# Patient Record
Sex: Male | Born: 1955 | Race: White | Hispanic: No | Marital: Married
Health system: Southern US, Community
[De-identification: ages and names within clinical notes are randomized; demographics above are authoritative.]

---

## 1998-03-23 ENCOUNTER — Observation Stay (HOSPITAL_COMMUNITY): Admission: RE | Admit: 1998-03-23 | Discharge: 1998-03-23 | Payer: Self-pay | Admitting: Neurosurgery

## 1998-03-23 ENCOUNTER — Encounter: Payer: Self-pay | Admitting: Neurosurgery

## 2005-08-29 ENCOUNTER — Emergency Department (HOSPITAL_COMMUNITY): Admission: EM | Admit: 2005-08-29 | Discharge: 2005-08-29 | Payer: Self-pay | Admitting: Emergency Medicine

## 2010-06-18 ENCOUNTER — Other Ambulatory Visit: Payer: Self-pay | Admitting: Orthopedic Surgery

## 2010-06-18 ENCOUNTER — Ambulatory Visit
Admission: RE | Admit: 2010-06-18 | Discharge: 2010-06-18 | Disposition: A | Discharge status: Home / Self Care | Payer: BC Managed Care – PPO | Source: Ambulatory Visit | Attending: Orthopedic Surgery | Admitting: Orthopedic Surgery

## 2010-06-18 DIAGNOSIS — M25519 Pain in unspecified shoulder: Secondary | ICD-10-CM

## 2010-09-07 ENCOUNTER — Encounter (HOSPITAL_BASED_OUTPATIENT_CLINIC_OR_DEPARTMENT_OTHER)
Admission: RE | Admit: 2010-09-07 | Discharge: 2010-09-07 | Disposition: A | Discharge status: Home / Self Care | Payer: BC Managed Care – PPO | Source: Ambulatory Visit | Attending: Orthopedic Surgery | Admitting: Orthopedic Surgery

## 2010-09-11 ENCOUNTER — Ambulatory Visit (HOSPITAL_BASED_OUTPATIENT_CLINIC_OR_DEPARTMENT_OTHER)
Admission: RE | Admit: 2010-09-11 | Discharge: 2010-09-11 | Disposition: A | Discharge status: Home / Self Care | Payer: BC Managed Care – PPO | Source: Ambulatory Visit | Attending: Orthopedic Surgery | Admitting: Orthopedic Surgery

## 2010-09-11 DIAGNOSIS — Z01812 Encounter for preprocedural laboratory examination: Secondary | ICD-10-CM | POA: Insufficient documentation

## 2010-09-11 DIAGNOSIS — Z01810 Encounter for preprocedural cardiovascular examination: Secondary | ICD-10-CM | POA: Insufficient documentation

## 2010-09-11 DIAGNOSIS — S43499A Other sprain of unspecified shoulder joint, initial encounter: Secondary | ICD-10-CM | POA: Insufficient documentation

## 2010-09-11 DIAGNOSIS — X58XXXA Exposure to other specified factors, initial encounter: Secondary | ICD-10-CM | POA: Insufficient documentation

## 2010-09-11 DIAGNOSIS — S46819A Strain of other muscles, fascia and tendons at shoulder and upper arm level, unspecified arm, initial encounter: Secondary | ICD-10-CM | POA: Insufficient documentation

## 2010-09-11 LAB — POCT HEMOGLOBIN-HEMACUE: Hemoglobin: 14.9 g/dL (ref 13.0–17.0)

## 2010-10-07 NOTE — Op Note (Signed)
[name], Bill Bowen                 ACCOUNT NO.:  [id number]  MEDICAL RECORD NO.:  [id number]  LOCATION:                                 FACILITY:  PHYSICIAN:  Keylon Labelle A. Thurston Hole, M.D. DATE OF BIRTH:  1955-05-25  DATE OF PROCEDURE:  09/11/2010 DATE OF DISCHARGE:                              OPERATIVE REPORT   PREOPERATIVE DIAGNOSES: 1. Right shoulder partial labrum tear. 2. Right shoulder biceps tenodesis failure with re-rupture of the     biceps tendon.  POSTOPERATIVE DIAGNOSES: 1. Right shoulder partial labrum tear. 2. Right shoulder biceps tenodesis failure with re-rupture of the     biceps tendon.  PROCEDURE: 1. Right shoulder  examination under anaesthesia followed by     arthroscopic debridement posterior labrum tear. 2. Right shoulder subpectoral biceps tenodesis revision using Arthrex     5.5-mm suture anchor.  SURGEON:  Elana Alm. Thurston Hole, MD  ASSISTANT: 1. Teryl Lucy, MD 2. Skip Mayer, PA  ANESTHESIA:  General.  OPERATIVE TIME:  One hour.  COMPLICATIONS:  None.  INDICATIONS FOR PROCEDURE:  [name] is a 55 year old gentleman who previously undergone a right shoulder biceps tenodesis, arthroscopy, and labrum repair on May 30, 2010.  He had reinjured his shoulder on three separate occasions in the postoperative course and sustained a rupture of the biceps tenodesis.  He was having persistent pain with some musculocutaneous neuritis and thus it was felt necessary. After multiples discussions and consultations we elected to proceed with a biceps tenodesis revision and reinspection of his right shoulder labrum repair.  DESCRIPTION OF PROCEDURE:  [name] was brought to the operating room on September 11, 2010, after interscalene block was placed in holding area by Anesthesia.  He was placed on operating table in supine position.  He received Ancef 1 g IV preoperatively for prophylaxis.  After being placed under general anesthesia, his right shoulder was  examined.  He had full range of motion and his shoulder was stable, ligamentous exam. The biceps tendon stump could be well palpated at the muscular tendinous junction of the biceps.  He was placed in beach-chair position and the shoulder and arm was prepped using sterile DuraPrep and draped using sterile technique.  Time-out procedure was called and the correct right shoulder and arm identified.  Initially through a posterior arthroscopic portal, the arthroscope with a pump attached was placed into an anterior portal and arthroscopic probe was placed.  On initial inspection, the articular cartilage and the glenohumeral joint was intact.  The area of his previous superior labral repair was well healed, but he did have significant fraying and partial tearing of the posterior labrum 25-30% which was debrided.  The anterior labrum was intact.  The anteroinferior glenohumeral ligament complex was intact.  There was no biceps tendon evident in the joint.  The rotator cuff was completely intact.  The inferior capsular recess free of pathology.  Subacromial space was entered and a lateral arthroscopic portal was made.  The top of the rotator cuff was well visualized and was intact.  Area of this previous rotator interval repair was well healed and these sutures were noted. The bicipital groove was inspected for evidence  of any stump remaining from his previous biceps tenodesis, but this was not evident in the previous biceps tenodesis.  The screw was not seen thus by exam and previous MRI was found to be well buried in the bone.  At this point, through a 3-cm incision based over the biceps musculotendinous junction with the area of the rupture had settled and exposure was made.  The underlying subcutaneous tissues were incised along with skin incision. Blunt dissection was carried down to the biceps tendon stump and the musculotendinous junction was noted to be very fibrotic and this was freed  up.  Neurovascular structures carefully protected while this was done.  It was felt that a subpectoral revision biceps tenodesis would be the most satisfactory answer for this situation and thus in the mid to upper humerus in the subpectoral region, a drill hole was made using the Arthrex 5.5-mm suture anchor set and then this area was tapped and a 5.5- mm suture anchor was placed.  Each of the sutures and this anchor were passed in a mattress suture weaving technique through the remaining biceps tendon which only approximately 4-5 cm were made and then after the sutures were placed, there were then tied down with firm and tight fixation over the bone at the area of the suture anchor.  After this was done, there was found to be excellent recon total ring of the biceps muscle and tendon unit and no excessive tension, but excellent range of motion in the elbow was noted with excellent tensioning of the biceps tendon and muscular tendinous complex.  At this point, the wound was irrigated and then closed with 2-0 Vicryl and 4-0 Monocryl.  The arthroscopic portals closed with 3-0 nylon.  Sterile dressings and abduction sling applied.  The patient awakened and taken to recovery room in stable condition.  Needle and sponge counts correct x2 at the end the case.  Followup care [name], follows as an outpatient, on Percocet and Robaxin, and abduction sling.  He will be seen back in a week for wound check and followup.     Gradie Butrick A. Thurston Hole, M.D.     RAW/MEDQ  D:  09/11/2010  T:  09/12/2010  Job:  [id number]  Electronically Signed by Salvatore Marvel M.D. on 10/07/2010 02:56:37 PM

## 2012-09-07 ENCOUNTER — Ambulatory Visit
Admission: RE | Admit: 2012-09-07 | Discharge: 2012-09-07 | Disposition: A | Discharge status: Home / Self Care | Payer: BC Managed Care – PPO | Source: Ambulatory Visit | Attending: Family Medicine | Admitting: Family Medicine

## 2012-09-07 ENCOUNTER — Other Ambulatory Visit: Payer: Self-pay | Admitting: Family Medicine

## 2012-09-07 DIAGNOSIS — M79604 Pain in right leg: Secondary | ICD-10-CM

## 2012-09-07 DIAGNOSIS — R609 Edema, unspecified: Secondary | ICD-10-CM

## 2013-09-02 ENCOUNTER — Ambulatory Visit
Admission: RE | Admit: 2013-09-02 | Discharge: 2013-09-02 | Disposition: A | Discharge status: Home / Self Care | Payer: BC Managed Care – PPO | Source: Ambulatory Visit | Attending: Family Medicine | Admitting: Family Medicine

## 2013-09-02 ENCOUNTER — Other Ambulatory Visit: Payer: Self-pay | Admitting: Family Medicine

## 2013-09-02 ENCOUNTER — Encounter (INDEPENDENT_AMBULATORY_CARE_PROVIDER_SITE_OTHER): Payer: Self-pay

## 2013-09-02 ENCOUNTER — Other Ambulatory Visit: Payer: BC Managed Care – PPO

## 2013-09-02 DIAGNOSIS — R52 Pain, unspecified: Secondary | ICD-10-CM

## 2013-09-02 DIAGNOSIS — M79642 Pain in left hand: Secondary | ICD-10-CM

## 2015-07-06 ENCOUNTER — Ambulatory Visit
Admission: RE | Admit: 2015-07-06 | Discharge: 2015-07-06 | Disposition: A | Discharge status: Home / Self Care | Payer: BLUE CROSS/BLUE SHIELD | Source: Ambulatory Visit | Attending: Family Medicine | Admitting: Family Medicine

## 2015-07-06 ENCOUNTER — Other Ambulatory Visit: Payer: Self-pay | Admitting: Family Medicine

## 2015-07-06 DIAGNOSIS — N5089 Other specified disorders of the male genital organs: Secondary | ICD-10-CM

## 2015-11-17 ENCOUNTER — Other Ambulatory Visit: Payer: Self-pay | Admitting: Family Medicine

## 2015-11-17 ENCOUNTER — Ambulatory Visit
Admission: RE | Admit: 2015-11-17 | Discharge: 2015-11-17 | Disposition: A | Discharge status: Home / Self Care | Payer: BLUE CROSS/BLUE SHIELD | Source: Ambulatory Visit | Attending: Family Medicine | Admitting: Family Medicine

## 2015-11-17 DIAGNOSIS — R079 Chest pain, unspecified: Secondary | ICD-10-CM

## 2015-11-17 DIAGNOSIS — R0602 Shortness of breath: Secondary | ICD-10-CM

## 2015-11-17 MED ORDER — IOPAMIDOL (ISOVUE-370) INJECTION 76%
80.0000 mL | Freq: Once | INTRAVENOUS | Status: AC | PRN
  Administered 2015-11-17: 80 mL via INTRAVENOUS

## 2017-10-22 DIAGNOSIS — Z125 Encounter for screening for malignant neoplasm of prostate: Secondary | ICD-10-CM | POA: Diagnosis not present

## 2017-10-22 DIAGNOSIS — Z0000 Encounter for general adult medical examination without abnormal findings: Secondary | ICD-10-CM | POA: Diagnosis not present

## 2017-10-22 DIAGNOSIS — Z23 Encounter for immunization: Secondary | ICD-10-CM | POA: Diagnosis not present

## 2017-10-22 DIAGNOSIS — E039 Hypothyroidism, unspecified: Secondary | ICD-10-CM | POA: Diagnosis not present

## 2017-10-22 DIAGNOSIS — E559 Vitamin D deficiency, unspecified: Secondary | ICD-10-CM | POA: Diagnosis not present

## 2017-10-22 DIAGNOSIS — E7800 Pure hypercholesterolemia, unspecified: Secondary | ICD-10-CM | POA: Diagnosis not present

## 2017-10-22 DIAGNOSIS — R3911 Hesitancy of micturition: Secondary | ICD-10-CM | POA: Diagnosis not present

## 2017-10-22 DIAGNOSIS — E291 Testicular hypofunction: Secondary | ICD-10-CM | POA: Diagnosis not present

## 2017-12-05 IMAGING — CT CT ANGIO CHEST
3 of 7 series · 17 of 36 positions shown · IV contrast (isovue)
Comparison: None.

CLINICAL DATA: Unspecified chest pain.

EXAM:
CT ANGIOGRAPHY CHEST WITH CONTRAST
TECHNIQUE: Multidetector CT imaging of the chest was performed using the
standard protocol during bolus administration of intravenous
contrast. Multiplanar CT image reconstructions and MIPs were
obtained to evaluate the vascular anatomy.
CONTRAST:  80 cc Isovue 370 intravenous

[Series 5: pe cor 2.0 spo cor · coronal · 0.64mm/px · 1 of 129 slices shown]
[im 65/129  mediastinal]
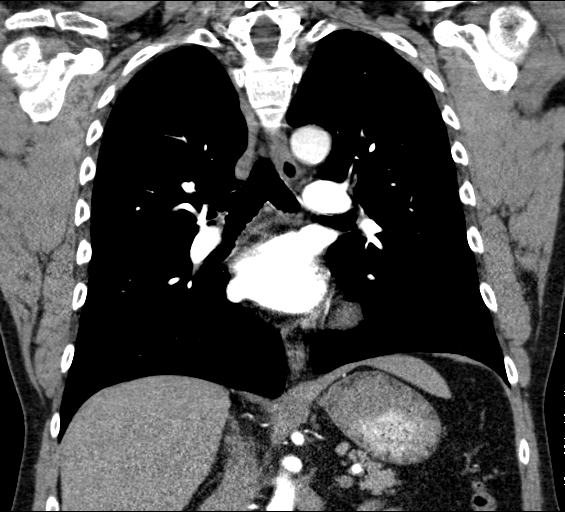

[Series 9: thins 1.5 b31s · axial · 0.71mm/px · z∈[-148,+134]mm · 14 of 218 slices shown]
[im 15/218  lung]
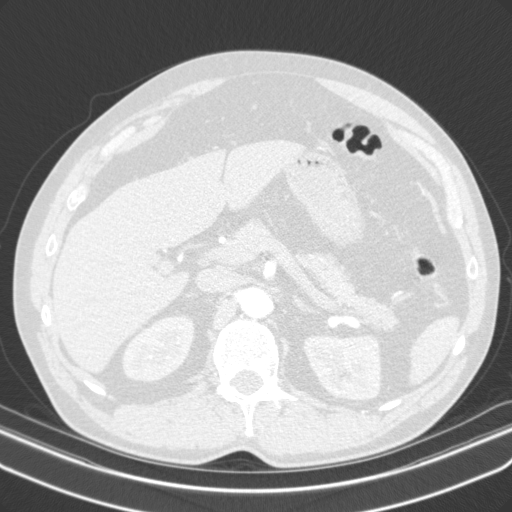
[im 29/218  mediastinal]
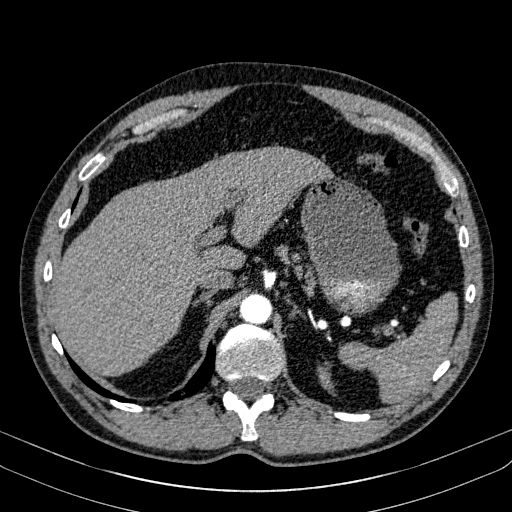
[im 44/218  lung]
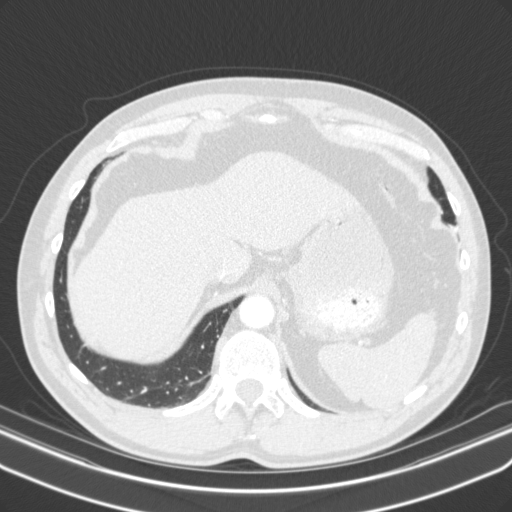
[im 58/218  mediastinal]
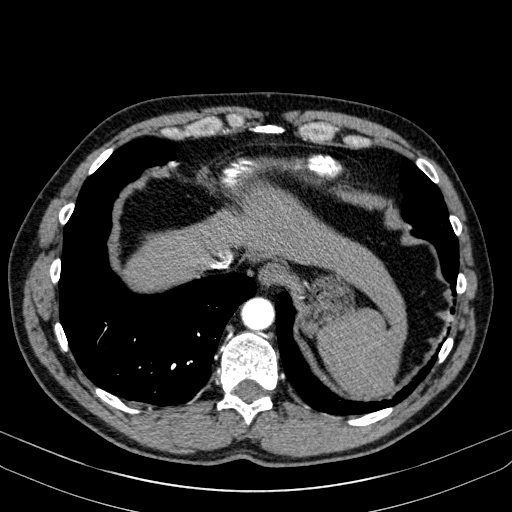
[im 73/218  lung]
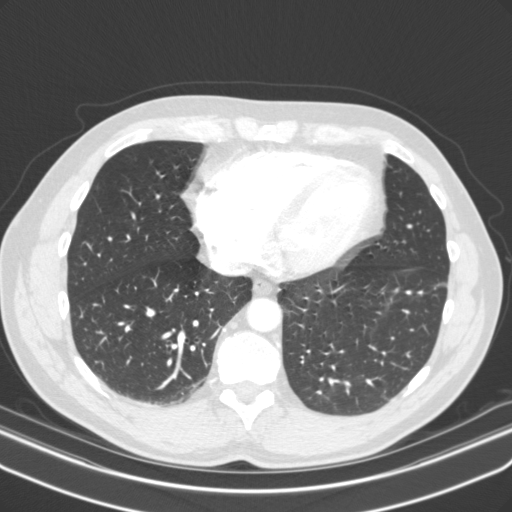
[im 87/218  mediastinal]
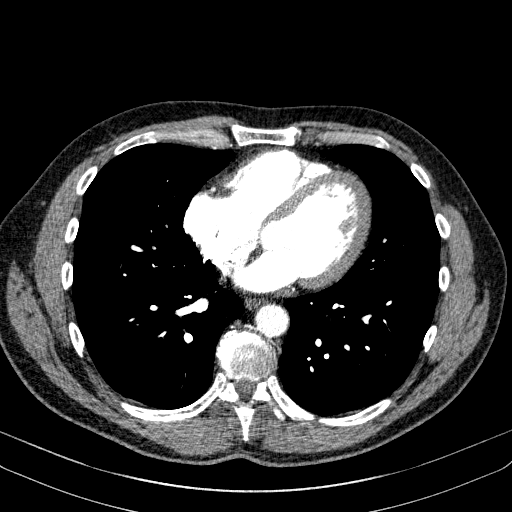
[im 102/218  lung]
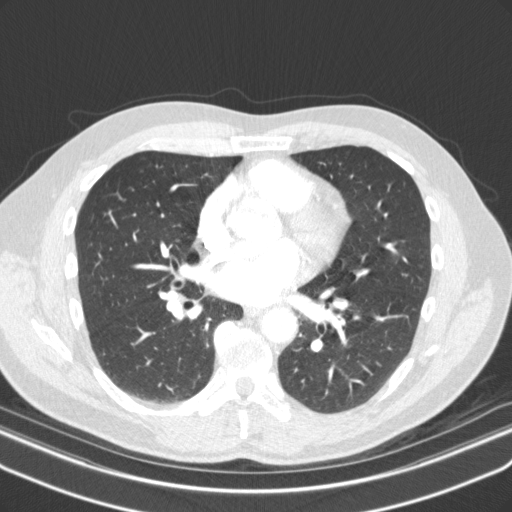
[im 116/218  mediastinal]
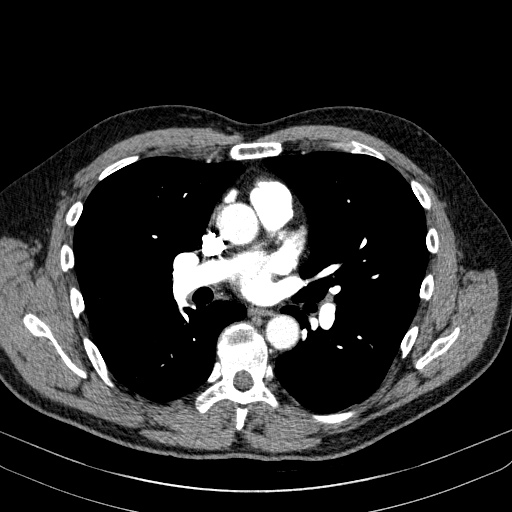
[im 131/218  lung]
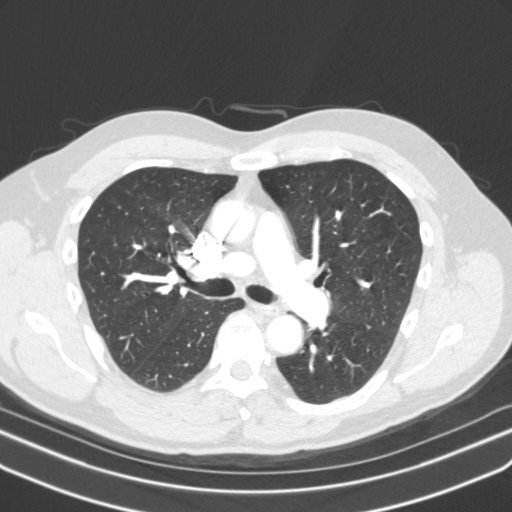
[im 145/218  mediastinal]
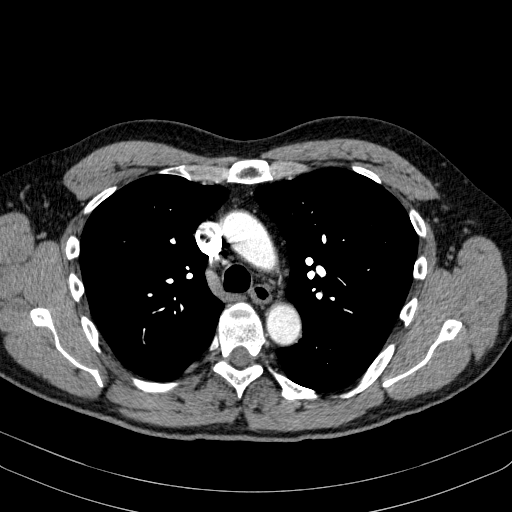
[im 160/218  lung]
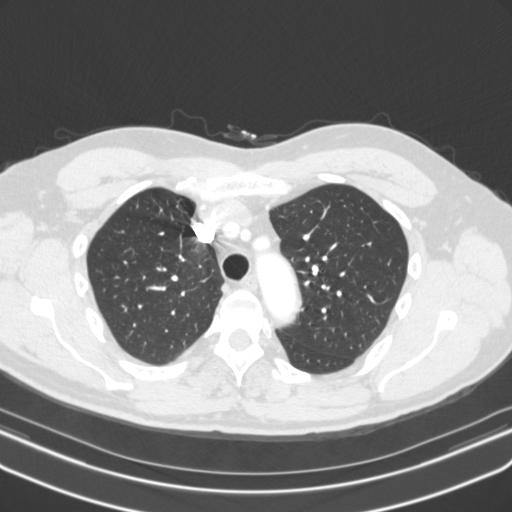
[im 174/218  mediastinal]
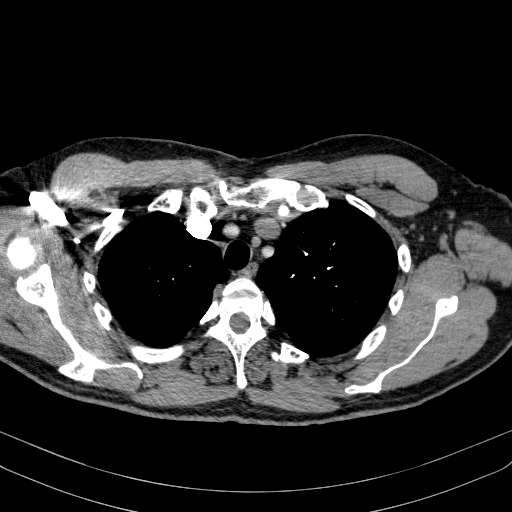
[im 189/218  lung]
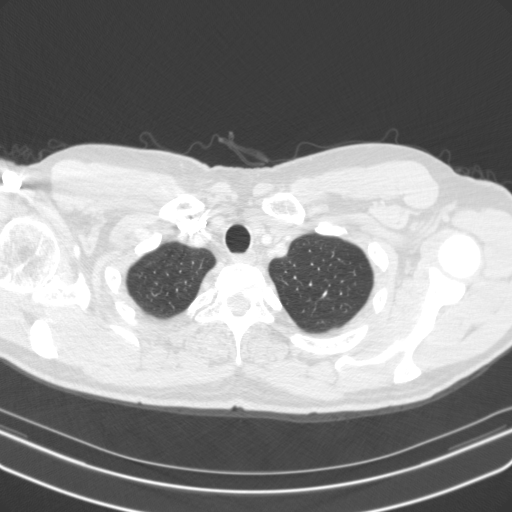
[im 203/218  mediastinal]
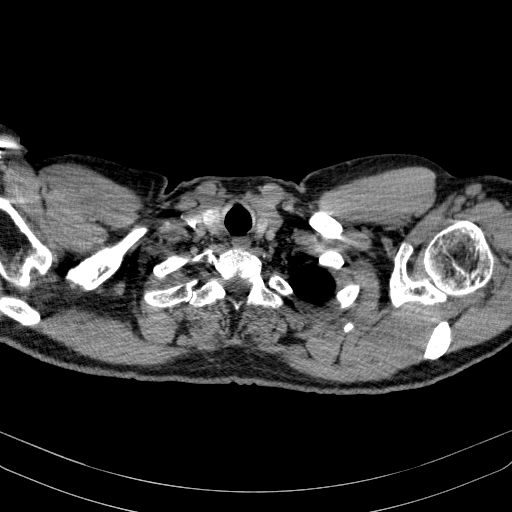

[Series 11: lung · axial · 0.71mm/px · z∈[-44,+56]mm · 2 of 60 slices shown]
[im 20/60  mediastinal]
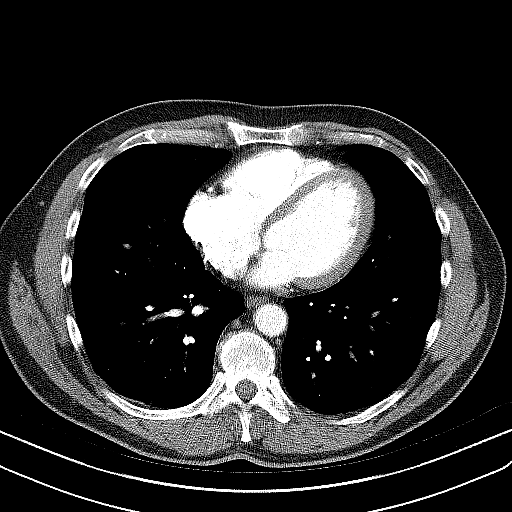
[im 40/60  mediastinal]
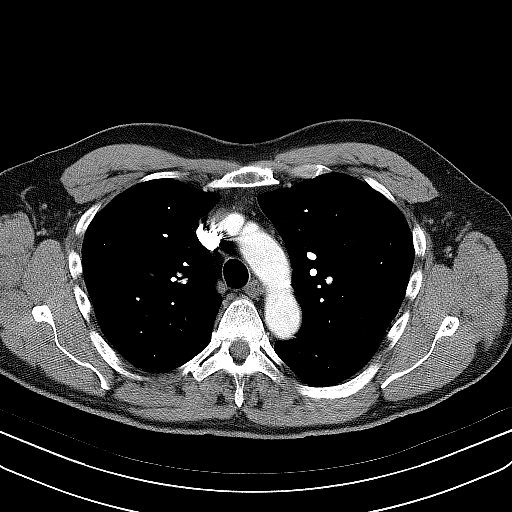

[17 of 36 positions shown; findings below may reference images not displayed]

FINDINGS: Cardiovascular: Satisfactory opacification of the pulmonary arteries
to the segmental level. No evidence of pulmonary embolism. Normal
heart size. No pericardial effusion. No noted atherosclerosis.
Negative thoracic aorta

Mediastinum/Nodes: No adenopathy or mass.

Lungs/Pleura: There is no edema, consolidation, effusion, or
pneumothorax. Minor atelectasis at the bases.

Upper Abdomen: No acute finding.  Incidental 15 mm right renal cyst.

Musculoskeletal: No acute or aggressive finding

Review of the MIP images confirms the above findings.
IMPRESSION: Negative for pulmonary embolism or other acute finding.
